# Patient Record
Sex: Female | Born: 1985 | Race: Black or African American | Hispanic: No | Marital: Single | Smoking: Never smoker
Health system: Southern US, Community
[De-identification: ages and names within clinical notes are randomized; demographics above are authoritative.]

## PROBLEM LIST (undated history)

## (undated) ENCOUNTER — Emergency Department (HOSPITAL_BASED_OUTPATIENT_CLINIC_OR_DEPARTMENT_OTHER): Discharge status: Absent without leave | Payer: Medicaid Other

---

## 2016-03-22 ENCOUNTER — Encounter (HOSPITAL_BASED_OUTPATIENT_CLINIC_OR_DEPARTMENT_OTHER): Payer: Self-pay | Admitting: Emergency Medicine

## 2016-03-22 ENCOUNTER — Emergency Department (HOSPITAL_BASED_OUTPATIENT_CLINIC_OR_DEPARTMENT_OTHER)
Admission: EM | Admit: 2016-03-22 | Discharge: 2016-03-22 | Disposition: A | Discharge status: Home / Self Care | Payer: Medicaid Other | Attending: Emergency Medicine | Admitting: Emergency Medicine

## 2016-03-22 DIAGNOSIS — H578 Other specified disorders of eye and adnexa: Secondary | ICD-10-CM | POA: Diagnosis present

## 2016-03-22 NOTE — ED Notes (Signed)
No answer

## 2016-03-22 NOTE — ED Triage Notes (Signed)
Left eye irritation for two days.  Pt states she has redness and tearing.  No known foreign body.

## 2016-10-28 ENCOUNTER — Emergency Department (HOSPITAL_BASED_OUTPATIENT_CLINIC_OR_DEPARTMENT_OTHER)
Admission: EM | Admit: 2016-10-28 | Discharge: 2016-10-28 | Disposition: A | Discharge status: Home / Self Care | Payer: Self-pay | Attending: Emergency Medicine | Admitting: Emergency Medicine

## 2016-10-28 ENCOUNTER — Encounter (HOSPITAL_BASED_OUTPATIENT_CLINIC_OR_DEPARTMENT_OTHER): Payer: Self-pay | Admitting: Emergency Medicine

## 2016-10-28 ENCOUNTER — Emergency Department (HOSPITAL_BASED_OUTPATIENT_CLINIC_OR_DEPARTMENT_OTHER): Payer: Self-pay

## 2016-10-28 DIAGNOSIS — M79605 Pain in left leg: Secondary | ICD-10-CM | POA: Insufficient documentation

## 2016-10-28 LAB — BASIC METABOLIC PANEL
Anion gap: 7 (ref 5–15)
BUN: 11 mg/dL (ref 6–20)
CHLORIDE: 104 mmol/L (ref 101–111)
CO2: 23 mmol/L (ref 22–32)
Calcium: 9.1 mg/dL (ref 8.9–10.3)
Creatinine, Ser: 0.78 mg/dL (ref 0.44–1.00)
GFR calc non Af Amer: >60 mL/min (ref >60)
Glucose, Bld: 91 mg/dL (ref 65–99)
POTASSIUM: 4 mmol/L (ref 3.5–5.1)
SODIUM: 134 mmol/L — AB (ref 135–145)

## 2016-10-28 NOTE — ED Triage Notes (Signed)
Patient states that she woke up last night with cramping to her left leg. The patient reports that she now has continues ache to her left calf. The patient is walking without difficulty but reports that she has worse pain with walking

## 2016-10-28 NOTE — Discharge Instructions (Signed)
Read the information below.  You may return to the Emergency Department at any time for worsening condition or any new symptoms that concern you. °

## 2016-10-28 NOTE — ED Notes (Signed)
Back from US.

## 2016-10-28 NOTE — ED Provider Notes (Signed)
[facility]   CSN: [id number] Arrival date & time: 10/28/16  4098     History   Chief Complaint Chief Complaint  Patient presents with  . Leg Pain    HPI Nancy Schneider is a 31 y.o. female.  HPI   Ptp presents with left calf pain that began last night.  States that during the night she developed cramping in her bilateral legs, L>R.  This morning she continued to have soreness in her left calf, worse with walking and standing.  She has taken no medications for her pain.  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  Denies recent immobilization, exogenous estrogen, history of blood clots.  Denies CP, SOB, cough, fevers, weakness or numbness of the legs.    History reviewed. No pertinent past medical history.  There are no active problems to display for this patient.   History reviewed. No pertinent surgical history.  OB History    No data available       Home Medications    Prior to Admission medications   Not on File    Family History History reviewed. No pertinent family history.  Social History Social History  Substance Use Topics  . Smoking status: Never Smoker  . Smokeless tobacco: Never Used  . Alcohol use No     Allergies   Patient has no known allergies.   Review of Systems Review of Systems  All other systems reviewed and are negative.    Physical Exam Updated Vital Signs BP 122/77 (BP Location: Right Arm)   Pulse 74   Temp 98.5 F (36.9 C) (Oral)   Resp 17   LMP 10/14/2016   SpO2 98%   Physical Exam  Constitutional: She appears well-developed and well-nourished. No distress.  HENT:  Head: Normocephalic and atraumatic.  Neck: Neck supple.  Cardiovascular: Normal rate and intact distal pulses.   Pulmonary/Chest: Effort normal.  Musculoskeletal: Normal range of motion. She exhibits no edema or deformity.  Left calf tender to  palpation.  No erythema or warmth.  Lower extremities:  Strength 5/5, sensation intact, distal pulses intact.   No bony tenderness.    Neurological: She is alert.  Skin: She is not diaphoretic.  Nursing note and vitals reviewed.    ED Treatments / Results  Labs (all labs ordered are listed, but only abnormal results are displayed) Labs Reviewed  BASIC METABOLIC PANEL - Abnormal; Notable for the following:       Result Value   Sodium 134 (*)    All other components within normal limits    EKG  EKG Interpretation None       Radiology US Venous Img Lower Unilateral Left  Result Date: 10/28/2016 CLINICAL DATA:  Left calf cramping this morning. EXAM: Left LOWER EXTREMITY VENOUS DOPPLER ULTRASOUND TECHNIQUE: Gray-scale sonography with graded compression, as well as color Doppler and duplex ultrasound were performed to evaluate the lower extremity deep venous systems from the level of the common femoral vein and including the common femoral, femoral, profunda femoral, popliteal and calf veins including the posterior tibial, peroneal and gastrocnemius veins when visible. The superficial great saphenous vein was also interrogated. Spectral Doppler was utilized to evaluate flow at rest and with distal augmentation maneuvers in the common femoral, femoral and popliteal veins. COMPARISON:  None. FINDINGS: Contralateral Common Femoral Vein: Respiratory phasicity is normal and symmetric with the symptomatic side. No evidence  of thrombus. Normal compressibility. Common Femoral Vein: No evidence of thrombus. Normal compressibility, respiratory phasicity and response to augmentation. Saphenofemoral Junction: No evidence of thrombus. Normal compressibility and flow on color Doppler imaging. Profunda Femoral Vein: No evidence of thrombus. Normal compressibility and flow on color Doppler imaging. Femoral Vein: No evidence of thrombus. Normal compressibility, respiratory phasicity and response to  augmentation. Popliteal Vein: No evidence of thrombus. Normal compressibility, respiratory phasicity and response to augmentation. Calf Veins: No evidence of thrombus. Normal compressibility and flow on color Doppler imaging. Superficial Great Saphenous Vein: No evidence of thrombus. Normal compressibility and flow on color Doppler imaging. Venous Reflux:  None. Other Findings:  None. IMPRESSION: No evidence of DVT within the left lower extremity. Electronically Signed   By: David  [place] M.D.   On: 10/28/2016 11:33    Procedures Procedures (including critical care time)  Medications Ordered in ED Medications - No data to display   Initial Impression / Assessment and Plan / ED Course  I have reviewed the triage vital signs and the nursing notes.  Pertinent labs & imaging results that were available during my care of the patient were reviewed by me and considered in my medical decision making (see chart for details).     Afebrile, nontoxic patient with cramping in her legs overnight and soreness in her left calf this morning.  DVT study is negative.  Electrolytes unremarkable.  Likely soreness from muscle cramps overnight. Pt declines any medications.   D/C home with PCP follow up.  Discussed result, findings, treatment, and follow up  with patient.  Pt given return precautions.  Pt verbalizes understanding and agrees with plan.       Final Clinical Impressions(s) / ED Diagnoses   Final diagnoses:  Left leg pain    New Prescriptions New Prescriptions   No medications on file     Trixie DredgeWest, Asjia Berrios, [place]-C 10/28/16 1158    Geoffery Lyonselo, [place], MD 10/28/16 1515

## 2018-03-14 IMAGING — US US EXTREM LOW VENOUS*L*
1 series · 13 of 24 positions shown · non-contrast
Comparison: None.

CLINICAL DATA: Left calf cramping this morning.



[Series 1: us extrem low venous*left* · 0.07mm/px · 13 of 28 slices shown]
[im 1/28]
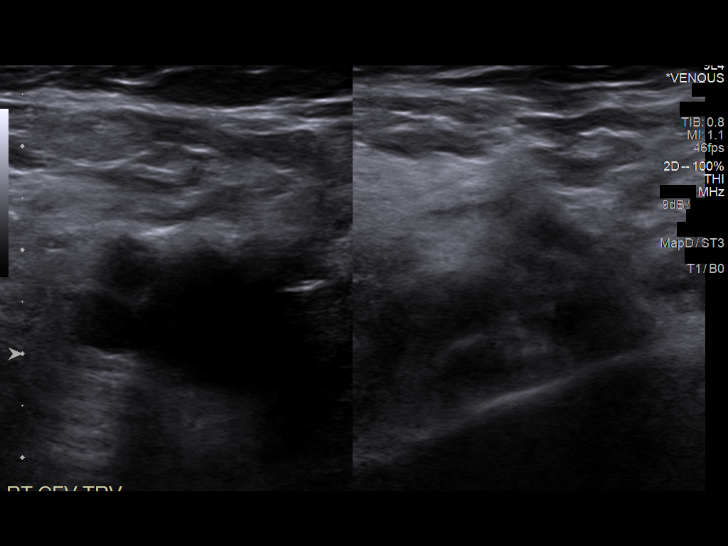
[im 3/28]
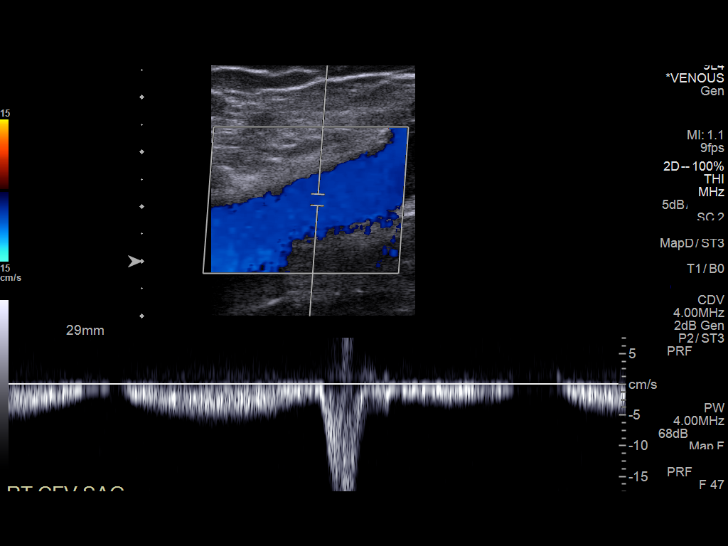
[im 5/28]
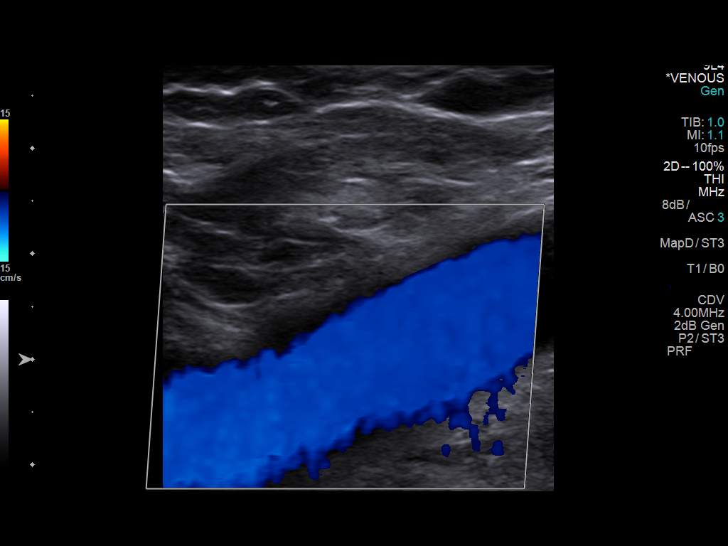
[im 8/28]
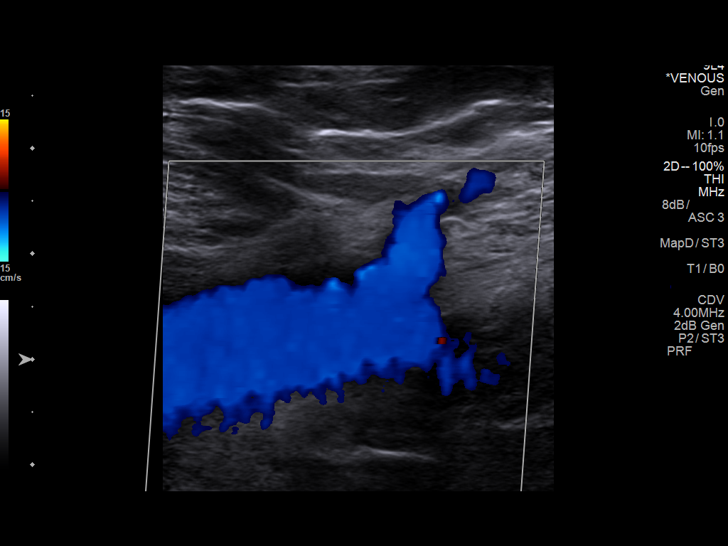
[im 10/28]
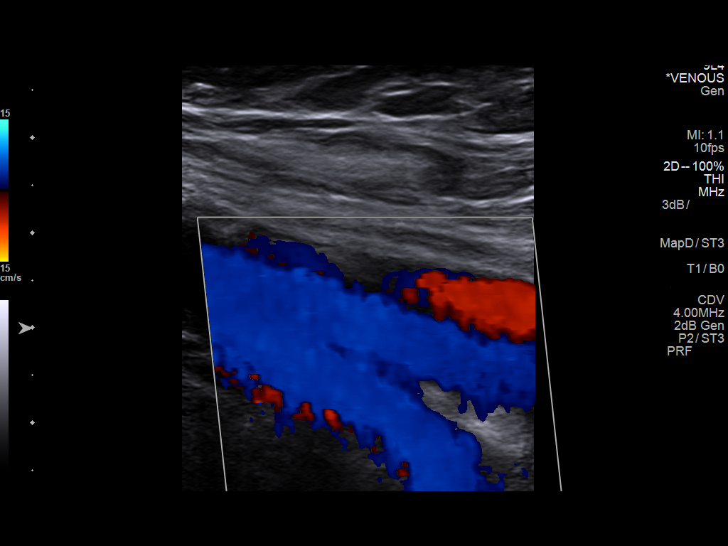
[im 12/28]
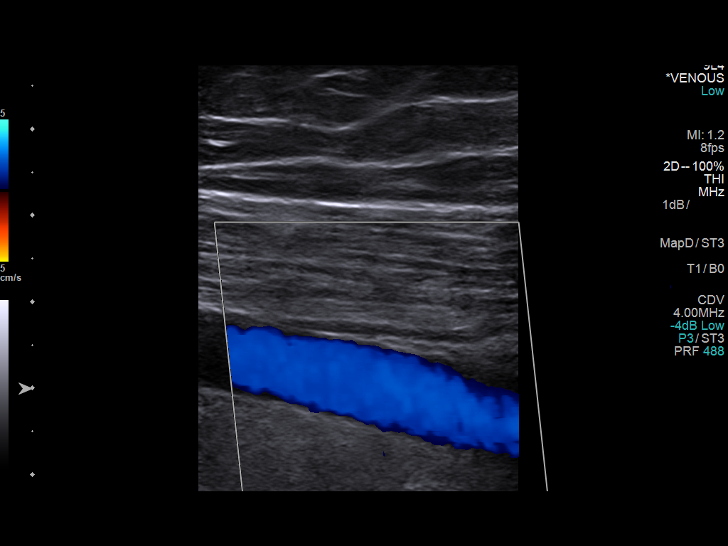
[im 15/28]
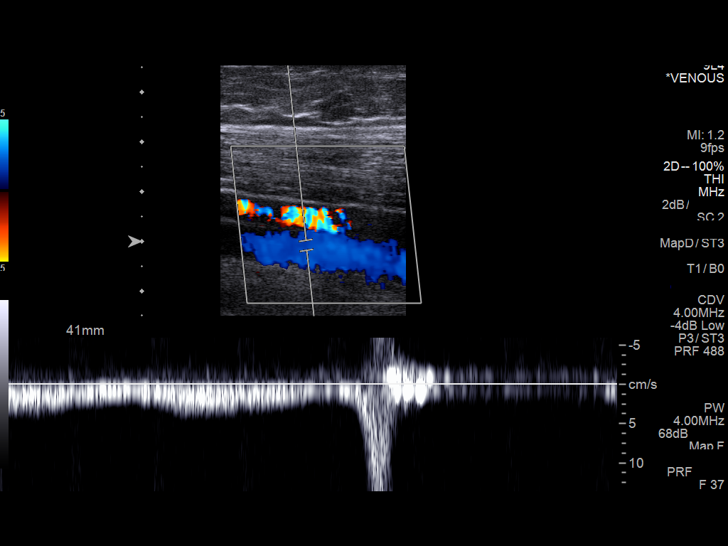
[im 16/28]
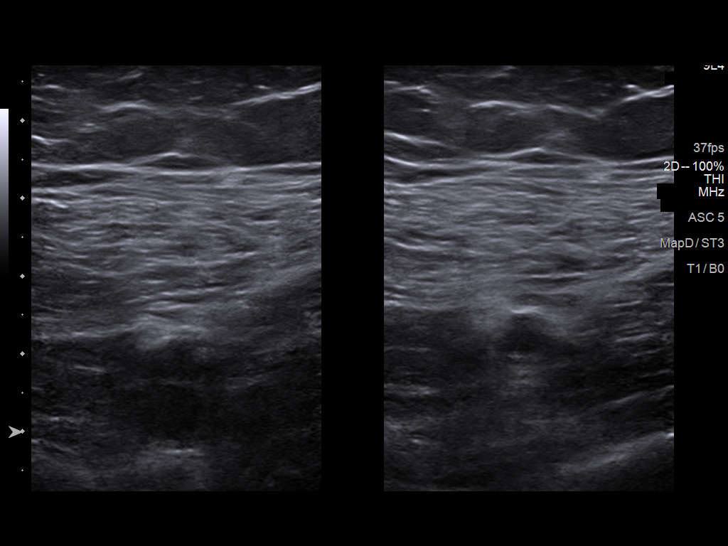
[im 18/28]
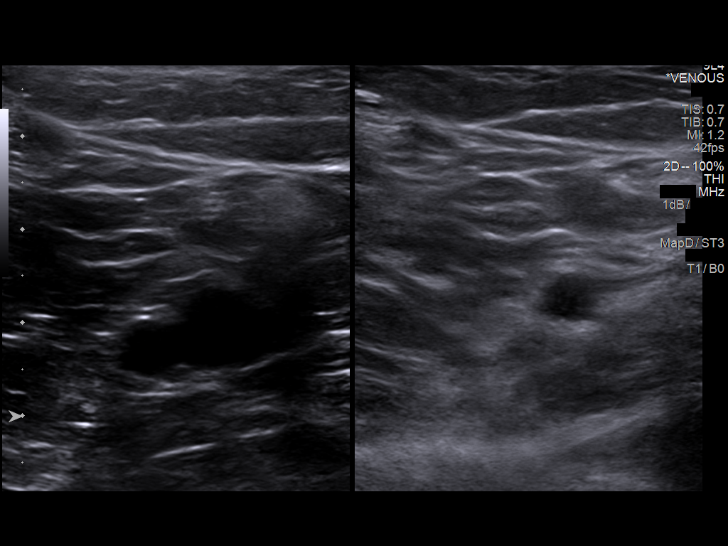
[im 20/28]
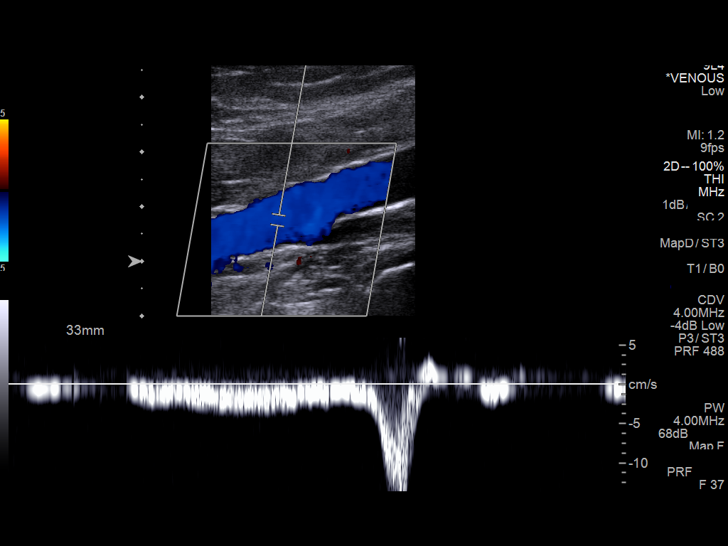
[im 23/28]
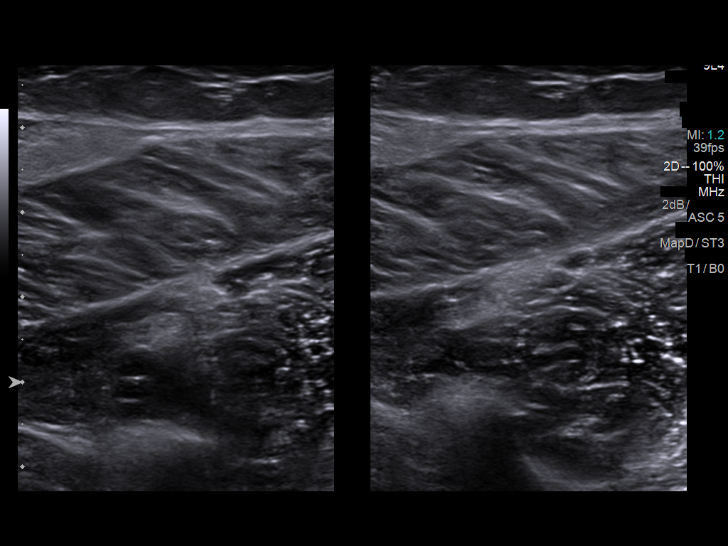
[im 25/28]
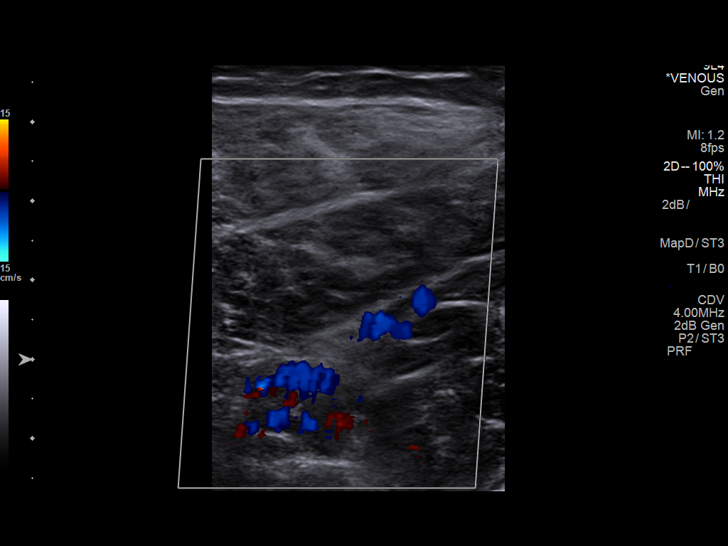
[im 28/28]
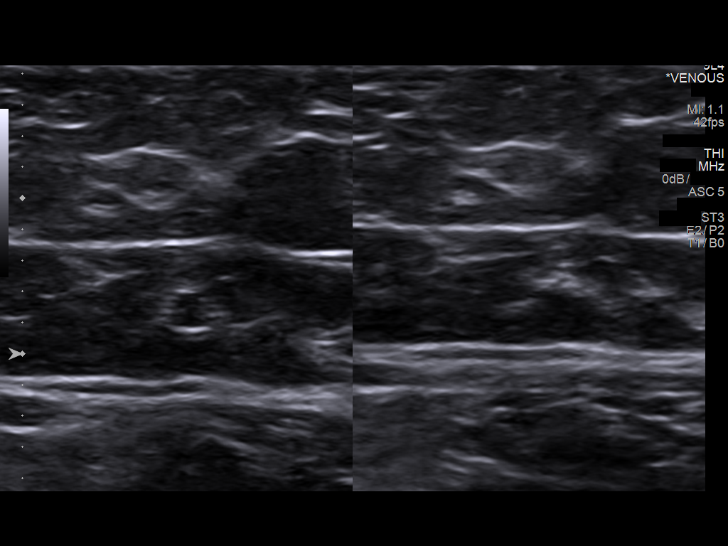

[13 of 24 positions shown; findings below may reference images not displayed]

FINDINGS: Contralateral Common Femoral Vein: Respiratory phasicity is normal
and symmetric with the symptomatic side. No evidence of thrombus.
Normal compressibility.

Common Femoral Vein: No evidence of thrombus. Normal
compressibility, respiratory phasicity and response to augmentation.

Saphenofemoral Junction: No evidence of thrombus. Normal
compressibility and flow on color Doppler imaging.

Profunda Femoral Vein: No evidence of thrombus. Normal
compressibility and flow on color Doppler imaging.

Femoral Vein: No evidence of thrombus. Normal compressibility,
respiratory phasicity and response to augmentation.

Popliteal Vein: No evidence of thrombus. Normal compressibility,
respiratory phasicity and response to augmentation.

Calf Veins: No evidence of thrombus. Normal compressibility and flow
on color Doppler imaging.

Superficial Great Saphenous Vein: No evidence of thrombus. Normal
compressibility and flow on color Doppler imaging.

Venous Reflux:  None.

Other Findings:  None.
IMPRESSION: No evidence of DVT within the left lower extremity.

## 2019-04-16 ENCOUNTER — Encounter (HOSPITAL_BASED_OUTPATIENT_CLINIC_OR_DEPARTMENT_OTHER): Payer: Self-pay | Admitting: Emergency Medicine

## 2019-04-16 ENCOUNTER — Emergency Department (HOSPITAL_BASED_OUTPATIENT_CLINIC_OR_DEPARTMENT_OTHER)
Admission: EM | Admit: 2019-04-16 | Discharge: 2019-04-16 | Disposition: A | Discharge status: Home / Self Care | Payer: Medicaid Other | Attending: Emergency Medicine | Admitting: Emergency Medicine

## 2019-04-16 ENCOUNTER — Other Ambulatory Visit: Payer: Self-pay

## 2019-04-16 DIAGNOSIS — Z20822 Contact with and (suspected) exposure to covid-19: Secondary | ICD-10-CM

## 2019-04-16 DIAGNOSIS — Z20828 Contact with and (suspected) exposure to other viral communicable diseases: Secondary | ICD-10-CM | POA: Diagnosis present

## 2019-04-16 NOTE — ED Provider Notes (Signed)
[facility] EMERGENCY DEPARTMENT Provider Note   CSN: [id number] Arrival date & time: 04/16/19  1093     History   Chief Complaint Chief Complaint  Patient presents with  . wants covid test    HPI Nancy Schneider is a 33 y.o. female.  She presents for Covid testing because her daughter tested positive.  She has no symptoms.  No fever no current headache, no body aches no sore throat nausea vomiting diarrhea.     The history is provided by the patient.    History reviewed. No pertinent past medical history.  There are no active problems to display for this patient.   History reviewed. No pertinent surgical history.   OB History   No obstetric history on file.      Home Medications    Prior to Admission medications   Not on File    Family History No family history on file.  Social History Social History   Tobacco Use  . Smoking status: Never Smoker  . Smokeless tobacco: Never Used  Substance Use Topics  . Alcohol use: No  . Drug use: No     Allergies   Patient has no known allergies.   Review of Systems Review of Systems  Constitutional: Negative for fever.  HENT: Negative for sore throat.   Eyes: Negative for visual disturbance.  Respiratory: Negative for shortness of breath.   Cardiovascular: Negative for chest pain.  Gastrointestinal: Negative for abdominal pain.  Genitourinary: Negative for dysuria.  Musculoskeletal: Negative for myalgias.  Skin: Negative for rash.  Neurological: Positive for headaches (none now).     Physical Exam Updated Vital Signs BP (!) 114/99 (BP Location: Right Arm)   Pulse 84   Temp 99 F (37.2 C)   Resp 18   Ht 5\' 4"  (1.626 m)   Wt 74.8 kg   SpO2 100%   BMI 28.32 kg/m   Physical Exam Constitutional:      Appearance: She is well-developed.  HENT:     Head: Normocephalic and atraumatic.  Eyes:     Conjunctiva/sclera: Conjunctivae normal.  Neck:     Musculoskeletal: Neck supple.   Cardiovascular:     Rate and Rhythm: Normal rate and regular rhythm.     Pulses: Normal pulses.  Pulmonary:     Effort: Pulmonary effort is normal.     Breath sounds: Normal breath sounds.  Skin:    General: Skin is warm and dry.  Neurological:     General: No focal deficit present.     Mental Status: She is alert.     GCS: GCS eye subscore is 4. GCS verbal subscore is 5. GCS motor subscore is 6.      ED Treatments / Results  Labs (all labs ordered are listed, but only abnormal results are displayed) Labs Reviewed  NOVEL CORONAVIRUS, NAA (HOSP ORDER, SEND-OUT TO REF LAB; TAT 18-24 HRS)    EKG None  Radiology No results found.  Procedures Procedures (including critical care time)  Medications Ordered in ED Medications - No data to display   Initial Impression / Assessment and Plan / ED Course  I have reviewed the triage vital signs and the nursing notes.  Pertinent labs & imaging results that were available during my care of the patient were reviewed by me and considered in my medical decision making (see chart for details).    Nancy Schneider was evaluated in Emergency Department on 04/16/2019 for the symptoms described in the history of  present illness. She was evaluated in the context of the global COVID-19 pandemic, which necessitated consideration that the patient might be at risk for infection with the SARS-CoV-2 virus that causes COVID-19. Institutional protocols and algorithms that pertain to the evaluation of patients at risk for COVID-19 are in a state of rapid change based on information released by regulatory bodies including the CDC and federal and state organizations. These policies and algorithms were followed during the patient's care in the ED.       Final Clinical Impressions(s) / ED Diagnoses   Final diagnoses:  Person under investigation for COVID-19    ED Discharge Orders    None       Terrilee Files, MD 04/16/19 1752

## 2019-04-16 NOTE — Discharge Instructions (Signed)
You were evaluated in the emergency department for close contact exposure with Covid.  You will need to isolate until your tests are resulted.

## 2019-04-16 NOTE — ED Triage Notes (Signed)
Patient reports daughter tested positive for covid so she would like a test as well.  Endorses a headache this week but denies any symptoms at this time.

## 2019-04-17 LAB — NOVEL CORONAVIRUS, NAA (HOSP ORDER, SEND-OUT TO REF LAB; TAT 18-24 HRS): SARS-CoV-2, NAA: NOT DETECTED

## 2020-01-05 ENCOUNTER — Other Ambulatory Visit: Payer: Self-pay

## 2020-01-05 ENCOUNTER — Encounter (HOSPITAL_BASED_OUTPATIENT_CLINIC_OR_DEPARTMENT_OTHER): Payer: Self-pay | Admitting: Emergency Medicine

## 2020-01-05 ENCOUNTER — Emergency Department (HOSPITAL_BASED_OUTPATIENT_CLINIC_OR_DEPARTMENT_OTHER)
Admission: EM | Admit: 2020-01-05 | Discharge: 2020-01-05 | Disposition: A | Discharge status: Home / Self Care | Payer: Medicaid Other | Attending: Emergency Medicine | Admitting: Emergency Medicine

## 2020-01-05 DIAGNOSIS — O8600 Infection of obstetric surgical wound, unspecified: Secondary | ICD-10-CM

## 2020-01-05 DIAGNOSIS — B9689 Other specified bacterial agents as the cause of diseases classified elsewhere: Secondary | ICD-10-CM | POA: Insufficient documentation

## 2020-01-05 DIAGNOSIS — O8609 Infection of obstetric surgical wound, other surgical site: Secondary | ICD-10-CM | POA: Diagnosis not present

## 2020-01-05 MED ORDER — CEPHALEXIN 250 MG PO CAPS
500.0000 mg | ORAL_CAPSULE | Freq: Once | ORAL | Status: AC
  Administered 2020-01-05: 500 mg via ORAL
  Filled 2020-01-05: qty 2

## 2020-01-05 MED ORDER — CEPHALEXIN 500 MG PO CAPS: 500.0000 mg | ORAL_CAPSULE | Freq: Three times a day (TID) | ORAL | 0 refills | Status: AC

## 2020-01-05 NOTE — Discharge Instructions (Signed)
Please read instructions below.  Keep your wound clean and covered. Soak/flush your wound with warm water, multiple times per day. Express the pus every hour while awake. You can take Advil/ibuprofen every 6 hours as needed for pain. Take the antibiotic, cephalexin/keflex, every 8 hours until gone. Schedule a follow up appointment with Dr. Allena Katz on Monday for close recheck and further management. Return to the ER for fever, worsening redness, or new or worsening symptoms.

## 2020-01-05 NOTE — ED Provider Notes (Signed)
[facility] EMERGENCY DEPARTMENT Provider Note   CSN: [id number] Arrival date & time: 01/05/20  1115     History Chief Complaint  Patient presents with  . Wound Dehiscence    Nancy Schneider is a 34 y.o. female, G2P2, 6 weeks postpartum after repeat low transverse C/S at [redacted]w[redacted]d on 11/23/2019 at [facility]. She is presenting with pain and drainage from C/S wound that began about 3 days ago. She states she initially noticed redness 3 days, and today it began draining pus. She endorses localized pain to the site, though no generalized abdominal pain. She is still having gradually improving vaginal bleeding, no other abnormal vaginal discharge. Denies fever, chills. She states she had uncomplicated pregnancy and postpartum course thus far. She is followed by Dr. Allena Katz with [facility]. She is not breastfeeding.   The history is provided by the patient and medical records.       History reviewed. No pertinent past medical history.  There are no problems to display for this patient.   Past Surgical History:  Procedure Laterality Date  . CESAREAN SECTION       OB History   No obstetric history on file.     History reviewed. No pertinent family history.  Social History   Tobacco Use  . Smoking status: Never Smoker  . Smokeless tobacco: Never Used  Substance Use Topics  . Alcohol use: No  . Drug use: No    Home Medications Prior to Admission medications   Medication Sig Start Date End Date Taking? Authorizing Provider  cephALEXin (KEFLEX) 500 MG capsule Take 1 capsule (500 mg total) by mouth 3 (three) times daily for 7 days. 01/05/20 01/12/20  Jahmani Staup, [place], PA-C    Allergies    Patient has no known allergies.  Review of Systems   Review of Systems  Constitutional: Negative for chills and fever.  Skin: Positive for color change and wound.  All other systems reviewed and are negative.   Physical Exam Updated Vital Signs BP 124/90 (BP  Location: Right Arm)   Pulse 72   Temp 98.5 F (36.9 C) (Oral)   Resp 16   Ht 5\' 4"  (1.626 m)   Wt 72.6 kg   SpO2 98%   Breastfeeding No   BMI 27.46 kg/m   Physical Exam Vitals and nursing note reviewed.  Constitutional:      General: She is not in acute distress.    Appearance: She is well-developed. She is not ill-appearing.  HENT:     Head: Normocephalic and atraumatic.  Eyes:     Conjunctiva/sclera: Conjunctivae normal.  Cardiovascular:     Rate and Rhythm: Normal rate and regular rhythm.  Pulmonary:     Effort: Pulmonary effort is normal.     Breath sounds: Normal breath sounds.  Abdominal:     General: Bowel sounds are normal.     Palpations: Abdomen is soft.     Tenderness: There is no abdominal tenderness. There is no guarding or rebound.  Skin:    General: Skin is warm.     Comments: C/S wound with purulent drainage from left lateral portion, with assoc induration and localized tenderness. No large dehiscence. No large surrounding erythema. Right portion of wound appears to be healing well.   Neurological:     Mental Status: She is alert.  Psychiatric:        Behavior: Behavior normal.     ED Results / Procedures / Treatments   Labs (  all labs ordered are listed, but only abnormal results are displayed) Labs Reviewed - No data to display  EKG None  Radiology No results found.  Procedures Procedures (including critical care time)  Medications Ordered in ED Medications  cephALEXin (KEFLEX) capsule 500 mg (500 mg Oral Given 01/05/20 1305)    ED Course  I have reviewed the triage vital signs and the nursing notes.  Pertinent labs & imaging results that were available during my care of the patient were reviewed by me and considered in my medical decision making (see chart for details).  Clinical Course as of Jan 04 1422  Sat Jan 05, 2020  1257 Consulted with Dr. Allena Katz with [facility]. He recommends Keflex BID x 1 week, warm compress, follow up on  Monday in office. Appreciate consult.   [JR]    Clinical Course User Index [JR] Danaly Bari, [place], PA-C   MDM Rules/Calculators/A&P                          Patient presenting to the ED 6 weeks postpartum with infected C-section site.  She noticed redness and drainage that began 3 days ago.  The left lateral aspect of the wound is draining pus that is expressed with palpation.  No significant surrounding signs of cellulitis.  No generalized abdominal pain.  Patient reports improving vaginal bleeding, no abnormal vaginal discharge.  No fevers or chills, she is very well-appearing and in no distress.  Consulted with her OB specialist at [facility], Dr. Allena Katz, who recommends Keflex twice daily for 1 week, continue warm compresses and expressing the wound every hour while awake.  Discussed these recommendations with patient.  She is also instructed to follow-up on Monday with Dr. Allena Katz in his clinic, per his request.  She is instructed of strict return precautions.  She verbalized understanding agrees with care plan.  Final Clinical Impression(s) / ED Diagnoses Final diagnoses:  Wound infection following cesarean section, postpartum    Rx / DC Orders ED Discharge Orders         Ordered    cephALEXin (KEFLEX) 500 MG capsule  3 times daily     Discontinue  Reprint     01/05/20 1303           Josep Luviano, [place], PA-C 01/05/20 1423    Maia Plan, MD 01/06/20 [phone]

## 2020-01-05 NOTE — ED Triage Notes (Signed)
Pt arrives pov with c/o drainage from cesarean section incision site on left side. No apparent bleeding, mild discharge noted. Pt denies fever, endorses tenderness

## 2020-01-05 NOTE — ED Notes (Signed)
Called [facility] line to get dr on call

## 2020-03-08 DIAGNOSIS — F1220 Cannabis dependence, uncomplicated: Secondary | ICD-10-CM | POA: Diagnosis present

## 2020-03-10 DIAGNOSIS — F3113 Bipolar disorder, current episode manic without psychotic features, severe: Secondary | ICD-10-CM | POA: Diagnosis present

## 2020-03-13 ENCOUNTER — Emergency Department (HOSPITAL_COMMUNITY)
Admission: EM | Admit: 2020-03-13 | Discharge: 2020-03-15 | Disposition: A | Discharge status: Other Acute Inpatient Hospital | Payer: Medicaid Other | Attending: Emergency Medicine | Admitting: Emergency Medicine

## 2020-03-13 DIAGNOSIS — R000 Tachycardia, unspecified: Secondary | ICD-10-CM | POA: Insufficient documentation

## 2020-03-13 DIAGNOSIS — R45851 Suicidal ideations: Secondary | ICD-10-CM | POA: Diagnosis not present

## 2020-03-13 DIAGNOSIS — F29 Unspecified psychosis not due to a substance or known physiological condition: Secondary | ICD-10-CM

## 2020-03-13 DIAGNOSIS — Z20822 Contact with and (suspected) exposure to covid-19: Secondary | ICD-10-CM | POA: Insufficient documentation

## 2020-03-13 DIAGNOSIS — F312 Bipolar disorder, current episode manic severe with psychotic features: Secondary | ICD-10-CM | POA: Diagnosis not present

## 2020-03-13 LAB — COMPREHENSIVE METABOLIC PANEL
ALT: 25 U/L (ref 0–44)
AST: 25 U/L (ref 15–41)
Albumin: 4.3 g/dL (ref 3.5–5.0)
Alkaline Phosphatase: 71 U/L (ref 38–126)
Anion gap: 10 (ref 5–15)
BUN: 13 mg/dL (ref 6–20)
CO2: 25 mmol/L (ref 22–32)
Calcium: 9.6 mg/dL (ref 8.9–10.3)
Chloride: 103 mmol/L (ref 98–111)
Creatinine, Ser: 0.97 mg/dL (ref 0.44–1.00)
GFR, Estimated: >60 mL/min (ref >60)
Glucose, Bld: 109 mg/dL — ABNORMAL HIGH (ref 70–99)
Potassium: 3.6 mmol/L (ref 3.5–5.1)
Sodium: 138 mmol/L (ref 135–145)
Total Bilirubin: 1 mg/dL (ref 0.3–1.2)
Total Protein: 7.7 g/dL (ref 6.5–8.1)

## 2020-03-13 LAB — CBC WITH DIFFERENTIAL/PLATELET
Abs Immature Granulocytes: 0.01 10*3/uL (ref 0.00–0.07)
Basophils Absolute: 0.1 10*3/uL (ref 0.0–0.1)
Basophils Relative: 1 %
Eosinophils Absolute: 0.1 10*3/uL (ref 0.0–0.5)
Eosinophils Relative: 2 %
HCT: 39 % (ref 36.0–46.0)
Hemoglobin: 13.5 g/dL (ref 12.0–15.0)
Immature Granulocytes: 0 %
Lymphocytes Relative: 32 %
Lymphs Abs: 1.9 10*3/uL (ref 0.7–4.0)
MCH: 31.6 pg (ref 26.0–34.0)
MCHC: 34.6 g/dL (ref 30.0–36.0)
MCV: 91.3 fL (ref 80.0–100.0)
Monocytes Absolute: 0.7 10*3/uL (ref 0.1–1.0)
Monocytes Relative: 13 %
Neutro Abs: 3 10*3/uL (ref 1.7–7.7)
Neutrophils Relative %: 52 %
Platelets: 408 10*3/uL — ABNORMAL HIGH (ref 150–400)
RBC: 4.27 MIL/uL (ref 3.87–5.11)
RDW: 13.7 % (ref 11.5–15.5)
WBC: 5.7 10*3/uL (ref 4.0–10.5)
nRBC: 0 % (ref 0.0–0.2)

## 2020-03-13 LAB — RAPID URINE DRUG SCREEN, HOSP PERFORMED
Amphetamines: NOT DETECTED
Barbiturates: NOT DETECTED
Benzodiazepines: NOT DETECTED
Cocaine: NOT DETECTED
Opiates: NOT DETECTED
Tetrahydrocannabinol: POSITIVE — AB

## 2020-03-13 LAB — ETHANOL: Alcohol, Ethyl (B): <10 mg/dL (ref <10)

## 2020-03-13 LAB — I-STAT BETA HCG BLOOD, ED (MC, WL, AP ONLY): I-stat hCG, quantitative: <5 m[IU]/mL (ref <5)

## 2020-03-13 MED ORDER — ZIPRASIDONE MESYLATE 20 MG IM SOLR
20.0000 mg | Freq: Once | INTRAMUSCULAR | Status: AC
  Administered 2020-03-13: 20 mg via INTRAMUSCULAR
  Filled 2020-03-13: qty 20

## 2020-03-13 MED ORDER — DIVALPROEX SODIUM ER 500 MG PO TB24
500.0000 mg | ORAL_TABLET | Freq: Every day | ORAL | Status: DC
  Administered 2020-03-13 – 2020-03-15 (×3): 500 mg via ORAL
  Filled 2020-03-13 (×3): qty 1

## 2020-03-13 MED ORDER — ONDANSETRON HCL 4 MG PO TABS: 4.0000 mg | ORAL_TABLET | Freq: Three times a day (TID) | ORAL | Status: DC | PRN

## 2020-03-13 MED ORDER — ACETAMINOPHEN 325 MG PO TABS
650.0000 mg | ORAL_TABLET | ORAL | Status: DC | PRN
  Administered 2020-03-14 (×2): 650 mg via ORAL
  Filled 2020-03-13 (×2): qty 2

## 2020-03-13 MED ORDER — QUETIAPINE FUMARATE 100 MG PO TABS
100.0000 mg | ORAL_TABLET | Freq: Every day | ORAL | Status: DC
  Administered 2020-03-13 – 2020-03-14 (×2): 100 mg via ORAL
  Filled 2020-03-13 (×2): qty 1

## 2020-03-13 MED ORDER — ALUM & MAG HYDROXIDE-SIMETH 200-200-20 MG/5ML PO SUSP: 30.0000 mL | Freq: Four times a day (QID) | ORAL | Status: DC | PRN

## 2020-03-13 MED ORDER — STERILE WATER FOR INJECTION IJ SOLN
INTRAMUSCULAR | Status: AC
  Filled 2020-03-13: qty 10
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
## 2020-03-13 NOTE — ED Notes (Signed)
Pt disorganized, labile, screaming at times, uncooperative and difficult to redirect at times.  PRN Geodon 20 mg IM given,effective Pt resting comfortably

## 2020-03-14 LAB — RESPIRATORY PANEL BY RT PCR (FLU A&B, COVID)
Influenza A by PCR: NEGATIVE
Influenza B by PCR: NEGATIVE
SARS Coronavirus 2 by RT PCR: NEGATIVE

## 2020-03-14 MED ORDER — LORAZEPAM 2 MG/ML IJ SOLN
2.0000 mg | Freq: Once | INTRAMUSCULAR | Status: AC
  Administered 2020-03-14: 2 mg via INTRAMUSCULAR
  Filled 2020-03-14: qty 1

## 2020-03-14 NOTE — ED Notes (Signed)
Informed Junior, RN police said patient keeps calling them.

## 2020-03-14 NOTE — ED Notes (Signed)
Patient making a personal phone call  

## 2020-03-14 NOTE — ED Notes (Signed)
Patient's lunch tray delivered.

## 2020-03-14 NOTE — ED Notes (Signed)
Patient sleeping/rise and fall of chest breathing observed 
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
## 2020-03-14 NOTE — ED Notes (Signed)
Pt laying in bed, alert.  Calm at this time, sitter at bedside.

## 2020-03-14 NOTE — BH Assessment (Signed)
[facility] Assessment Progress Note  Per Nira Conn, NP, this pt requires psychiatric hospitalization at this time.  Pt presents under IVC initiated by EDP Meridee Score, MD.  As of this writing, pt is under consideration for admission at [facility] and IVC documents have been faxed to [facility].  [facility] is reportedly at capacity.  EDP Lorre Nick, MD and pt's nurse, Junior, have been notified.  Pt's final disposition is pending.  Doylene Canning, [place] Behavioral Health Coordinator [phone]

## 2020-03-15 ENCOUNTER — Inpatient Hospital Stay (HOSPITAL_COMMUNITY)
Admission: AD | Admit: 2020-03-15 | Discharge: 2020-03-18 | DRG: 885 | Disposition: A | Discharge status: Home / Self Care | Payer: Medicaid Other | Source: Intra-hospital | Attending: Psychiatry | Admitting: Psychiatry

## 2020-03-15 ENCOUNTER — Encounter (HOSPITAL_COMMUNITY): Payer: Self-pay | Admitting: Nurse Practitioner

## 2020-03-15 DIAGNOSIS — Y92410 Unspecified street and highway as the place of occurrence of the external cause: Secondary | ICD-10-CM | POA: Diagnosis not present

## 2020-03-15 DIAGNOSIS — R739 Hyperglycemia, unspecified: Secondary | ICD-10-CM | POA: Diagnosis present

## 2020-03-15 DIAGNOSIS — G4700 Insomnia, unspecified: Secondary | ICD-10-CM | POA: Diagnosis present

## 2020-03-15 DIAGNOSIS — Z9114 Patient's other noncompliance with medication regimen: Secondary | ICD-10-CM

## 2020-03-15 DIAGNOSIS — F1910 Other psychoactive substance abuse, uncomplicated: Secondary | ICD-10-CM | POA: Clinically undetermined

## 2020-03-15 DIAGNOSIS — F3113 Bipolar disorder, current episode manic without psychotic features, severe: Secondary | ICD-10-CM | POA: Diagnosis present

## 2020-03-15 DIAGNOSIS — F1220 Cannabis dependence, uncomplicated: Secondary | ICD-10-CM | POA: Diagnosis present

## 2020-03-15 DIAGNOSIS — F419 Anxiety disorder, unspecified: Secondary | ICD-10-CM | POA: Diagnosis present

## 2020-03-15 DIAGNOSIS — F319 Bipolar disorder, unspecified: Secondary | ICD-10-CM | POA: Diagnosis present

## 2020-03-15 DIAGNOSIS — F312 Bipolar disorder, current episode manic severe with psychotic features: Secondary | ICD-10-CM

## 2020-03-15 MED ORDER — DIVALPROEX SODIUM ER 500 MG PO TB24: 500.0000 mg | ORAL_TABLET | Freq: Every day | ORAL | Status: DC

## 2020-03-15 MED ORDER — OLANZAPINE 10 MG PO TABS
30.0000 mg | ORAL_TABLET | Freq: Once | ORAL | Status: DC
  Filled 2020-03-15: qty 3

## 2020-03-15 MED ORDER — OLANZAPINE 10 MG PO TABS
10.0000 mg | ORAL_TABLET | Freq: Two times a day (BID) | ORAL | Status: DC
  Administered 2020-03-15 – 2020-03-18 (×5): 10 mg via ORAL
  Filled 2020-03-15 (×9): qty 1

## 2020-03-15 MED ORDER — DIPHENHYDRAMINE HCL 25 MG PO CAPS
50.0000 mg | ORAL_CAPSULE | Freq: Once | ORAL | Status: AC
  Administered 2020-03-15: 50 mg via ORAL
  Filled 2020-03-15: qty 2

## 2020-03-15 MED ORDER — MAGNESIUM HYDROXIDE 400 MG/5ML PO SUSP
30.0000 mL | Freq: Every day | ORAL | Status: DC | PRN
  Administered 2020-03-18: 30 mL via ORAL
  Filled 2020-03-15: qty 30

## 2020-03-15 MED ORDER — LORAZEPAM 2 MG/ML IJ SOLN: 2.0000 mg | Freq: Once | INTRAMUSCULAR | Status: DC

## 2020-03-15 MED ORDER — OLANZAPINE 10 MG PO TBDP
10.0000 mg | ORAL_TABLET | Freq: Three times a day (TID) | ORAL | Status: DC | PRN
  Administered 2020-03-17: 10 mg via ORAL
  Filled 2020-03-15 (×2): qty 1

## 2020-03-15 MED ORDER — ZIPRASIDONE MESYLATE 20 MG IM SOLR
20.0000 mg | INTRAMUSCULAR | Status: AC | PRN
  Administered 2020-03-16: 20 mg via INTRAMUSCULAR
  Filled 2020-03-15 (×2): qty 20

## 2020-03-15 MED ORDER — ALUM & MAG HYDROXIDE-SIMETH 200-200-20 MG/5ML PO SUSP: 30.0000 mL | ORAL | Status: DC | PRN

## 2020-03-15 MED ORDER — OLANZAPINE 10 MG PO TBDP
10.0000 mg | ORAL_TABLET | Freq: Once | ORAL | Status: DC
  Filled 2020-03-15 (×2): qty 1

## 2020-03-15 MED ORDER — DIPHENHYDRAMINE HCL 25 MG PO CAPS
50.0000 mg | ORAL_CAPSULE | Freq: Four times a day (QID) | ORAL | Status: DC | PRN
  Administered 2020-03-16 – 2020-03-17 (×2): 50 mg via ORAL
  Filled 2020-03-15 (×2): qty 2

## 2020-03-15 MED ORDER — DIPHENHYDRAMINE HCL 50 MG/ML IJ SOLN
50.0000 mg | Freq: Once | INTRAMUSCULAR | Status: DC
  Filled 2020-03-15 (×2): qty 1

## 2020-03-15 MED ORDER — DIPHENHYDRAMINE HCL 50 MG/ML IJ SOLN
50.0000 mg | Freq: Once | INTRAMUSCULAR | Status: AC
  Administered 2020-03-15: 50 mg via INTRAMUSCULAR
  Filled 2020-03-15 (×2): qty 1

## 2020-03-15 MED ORDER — OLANZAPINE 10 MG PO TBDP
ORAL_TABLET | ORAL | Status: AC
  Filled 2020-03-15: qty 1

## 2020-03-15 MED ORDER — ACETAMINOPHEN 325 MG PO TABS
650.0000 mg | ORAL_TABLET | Freq: Four times a day (QID) | ORAL | Status: DC | PRN
  Administered 2020-03-15 – 2020-03-17 (×3): 650 mg via ORAL
  Filled 2020-03-15 (×3): qty 2

## 2020-03-15 MED ORDER — LORAZEPAM 2 MG/ML IJ SOLN
2.0000 mg | Freq: Once | INTRAMUSCULAR | Status: DC
  Filled 2020-03-15: qty 1

## 2020-03-15 MED ORDER — DIPHENHYDRAMINE HCL 50 MG PO CAPS
50.0000 mg | ORAL_CAPSULE | Freq: Once | ORAL | Status: AC
  Administered 2020-03-15: 50 mg via ORAL
  Filled 2020-03-15: qty 1

## 2020-03-15 MED ORDER — LORAZEPAM 1 MG PO TABS: 2.0000 mg | ORAL_TABLET | Freq: Once | ORAL | Status: DC

## 2020-03-15 MED ORDER — DIPHENHYDRAMINE HCL 25 MG PO CAPS
ORAL_CAPSULE | ORAL | Status: AC
  Filled 2020-03-15: qty 2

## 2020-03-15 MED ORDER — DIPHENHYDRAMINE HCL 50 MG/ML IJ SOLN
50.0000 mg | Freq: Four times a day (QID) | INTRAMUSCULAR | Status: DC | PRN
  Administered 2020-03-16: 50 mg via INTRAMUSCULAR
  Filled 2020-03-15: qty 1

## 2020-03-15 MED ORDER — QUETIAPINE FUMARATE 100 MG PO TABS: 100.0000 mg | ORAL_TABLET | Freq: Every day | ORAL | Status: DC

## 2020-03-15 MED ORDER — ZIPRASIDONE MESYLATE 20 MG IM SOLR
20.0000 mg | Freq: Once | INTRAMUSCULAR | Status: AC
  Administered 2020-03-15: 20 mg via INTRAMUSCULAR
  Filled 2020-03-15 (×2): qty 20

## 2020-03-15 MED ORDER — LORAZEPAM 2 MG/ML IJ SOLN
2.0000 mg | Freq: Four times a day (QID) | INTRAMUSCULAR | Status: DC | PRN
  Administered 2020-03-16: 2 mg via INTRAMUSCULAR
  Filled 2020-03-15: qty 1

## 2020-03-15 MED ORDER — HYDROXYZINE HCL 25 MG PO TABS
25.0000 mg | ORAL_TABLET | Freq: Three times a day (TID) | ORAL | Status: DC | PRN
  Administered 2020-03-15 – 2020-03-17 (×2): 25 mg via ORAL
  Filled 2020-03-15 (×3): qty 1

## 2020-03-15 MED ORDER — LORAZEPAM 1 MG PO TABS
ORAL_TABLET | ORAL | Status: AC
  Filled 2020-03-15: qty 2

## 2020-03-15 MED ORDER — TRAZODONE HCL 50 MG PO TABS
50.0000 mg | ORAL_TABLET | Freq: Every evening | ORAL | Status: DC | PRN
  Administered 2020-03-15 – 2020-03-16 (×2): 50 mg via ORAL
  Filled 2020-03-15 (×2): qty 1

## 2020-03-15 MED ORDER — LORAZEPAM 1 MG PO TABS
2.0000 mg | ORAL_TABLET | Freq: Four times a day (QID) | ORAL | Status: DC | PRN
  Administered 2020-03-16 – 2020-03-18 (×4): 2 mg via ORAL
  Filled 2020-03-15 (×4): qty 2

## 2020-03-15 MED ORDER — DIPHENHYDRAMINE HCL 50 MG/ML IJ SOLN
50.0000 mg | Freq: Once | INTRAMUSCULAR | Status: DC
  Filled 2020-03-15: qty 1

## 2020-03-15 MED ORDER — LORAZEPAM 2 MG/ML IJ SOLN
2.0000 mg | Freq: Once | INTRAMUSCULAR | Status: AC
  Administered 2020-03-15: 2 mg via INTRAMUSCULAR

## 2020-03-15 NOTE — BH Assessment (Signed)
[facility] Assessment Progress Note  Per [facility] Kim, patient has been accepted to [facility] 505-1 to Dr. Tamera Punt.  Pt can come over after 09:00 via law enforcement.  Nurse call report to [phone].  Nurse Reita Cliche informed.

## 2020-03-15 NOTE — Progress Notes (Signed)
Adult Psychoeducational Group Note  Date:  03/15/2020 Time:  8:21 PM  Group Topic/Focus:  Wrap-Up Group:   The focus of this group is to help patients review their daily goal of treatment and discuss progress on daily workbooks.  Participation Level:  Did Not Attend  Participation Quality:  Did Not Attend  Affect:  Did Not Attend  Cognitive:  Did Not Attend  Insight: None  Engagement in Group:  Did Not Attend  Modes of Intervention:  Did Not Attend  Additional Comments:  Pt did not attend evening wrap up group tonight.  Felipa Furnace 03/15/2020, 8:21 PM
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
## 2020-03-15 NOTE — ED Notes (Signed)
Pt to [facility] per provider. Pt alert, cooperative, no s/s of distress. DC information and belongings given to [employer] for [facility]. Pt ambulatory, escorted and transported by [facility]

## 2020-03-16 DIAGNOSIS — F1910 Other psychoactive substance abuse, uncomplicated: Secondary | ICD-10-CM | POA: Clinically undetermined

## 2020-03-16 DIAGNOSIS — F1220 Cannabis dependence, uncomplicated: Secondary | ICD-10-CM | POA: Diagnosis not present

## 2020-03-16 DIAGNOSIS — F312 Bipolar disorder, current episode manic severe with psychotic features: Secondary | ICD-10-CM | POA: Diagnosis not present

## 2020-03-16 DIAGNOSIS — F3113 Bipolar disorder, current episode manic without psychotic features, severe: Secondary | ICD-10-CM | POA: Diagnosis not present

## 2020-03-16 LAB — LIPID PANEL
Cholesterol: 202 mg/dL — ABNORMAL HIGH (ref 0–200)
HDL: 44 mg/dL (ref >40)
LDL Cholesterol: 136 mg/dL — ABNORMAL HIGH (ref 0–99)
Total CHOL/HDL Ratio: 4.6 RATIO
Triglycerides: 108 mg/dL (ref <150)
VLDL: 22 mg/dL (ref 0–40)

## 2020-03-16 LAB — VALPROIC ACID LEVEL: Valproic Acid Lvl: 50 ug/mL (ref 50.0–100.0)

## 2020-03-16 LAB — TSH: TSH: 0.741 u[IU]/mL (ref 0.350–4.500)

## 2020-03-16 MED ORDER — DIVALPROEX SODIUM ER 500 MG PO TB24
1000.0000 mg | ORAL_TABLET | Freq: Every day | ORAL | Status: DC
  Administered 2020-03-16 – 2020-03-17 (×2): 1000 mg via ORAL
  Filled 2020-03-16 (×4): qty 2

## 2020-03-16 MED ORDER — DIVALPROEX SODIUM ER 500 MG PO TB24
500.0000 mg | ORAL_TABLET | ORAL | Status: DC
  Administered 2020-03-17 – 2020-03-18 (×2): 500 mg via ORAL
  Filled 2020-03-16 (×4): qty 1

## 2020-03-16 MED ORDER — DIVALPROEX SODIUM ER 500 MG PO TB24
ORAL_TABLET | ORAL | Status: AC
  Filled 2020-03-16: qty 3

## 2020-03-16 MED ORDER — OLANZAPINE 10 MG PO TBDP
20.0000 mg | ORAL_TABLET | Freq: Once | ORAL | Status: AC
  Administered 2020-03-16: 20 mg via ORAL
  Filled 2020-03-16: qty 2

## 2020-03-16 MED ORDER — DIVALPROEX SODIUM ER 500 MG PO TB24
1500.0000 mg | ORAL_TABLET | Freq: Once | ORAL | Status: AC
  Administered 2020-03-16: 1500 mg via ORAL
  Filled 2020-03-16: qty 3

## 2020-03-16 NOTE — Progress Notes (Signed)
On approach, the patient was pleasant and cooperative. She reported previously taking Seroquel 400 mg at night and Depakote. She reported that both medications were helpful but the Seroquel did not help her sleep. She reported hallucinating yesterday and was apologetic for acting out. She denied SI/HI. She denied AVH.  Late morning she became verbally aggressive and threatening towards staff. She was observed provoking another patient on the unit and verbally aggressive towards that patient. On several occassions she was threatening, stating "I will tear this bitch down." She was heard slamming the doors in her room. She was difficult to redirect and refused po meds for agitation. She was administered IM prn medications for agitation without force or a manual hold. She then came up to the nurses station apologizing and stating that she likes getting the injections because of the way it makes her feel high. She stated, "you have to remember I used to chew ecstasy."   Orders reviewed. Vital signs reviewed. Verbal support provided. 15 minute checks performed for safety. Patient remains  on unit restriction for threatening behaviors.
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
## 2020-03-16 NOTE — Progress Notes (Signed)
Writer was speaking with another patient and she came up to nursing station, when asked if I could help her she asked could she get the phone numbers out of her locker for the towing company, When Clinical research associate asked if she had requested the numbers on day shift and explained to her that we are not to go back in lockers once belongings are locked up. I reported to her that now was not a good time but maybe later. She became upset and started cussing " fuck you bitch you ain't trying to help me." She then went in the dayroom and picked up the cup of colored pencils and threw tham in the floor. Walked down the hall yelling and cussing, slammed her door and still yelling where she could be heard at nursing station. Writer walked down to her room and spoke to her about her behavior and disturbing other patients on the hall. She reported that she didn't give a fuck. Writer asked that she come and take her medications early. She eventually came to take her meds. Will continue to monitor patients behavior.
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
## 2020-03-16 NOTE — [facility] Group Notes (Signed)
[facility] LCSW Group Therapy  03/16/2020 3:31 PM   Type of Therapy and Topic: Group Therapy: Anger Management   Description of Group: In this group, patients will learn helpful strategies and techniques to manage anger, express anger in alternative ways, change hostile attitudes, and prevent aggressive acts, such as verbal abuse and violence.This group will be process-oriented and eductional, with patients participating in exploration of their own experiences as well as giving and receiving support and challenge from other group members.  Therapeutic Goals: 1. Patient will learn to manage anger. 2. Patient will learn to stop violence or the threat of violence. 3. Patient will learn to develop self control over thoughts and actions. 4. Patient will receive support and feedback from others  Therapeutic Modalities: Cognitive Behavioral Therapy Solution Focused Therapy Motivational Interviewing  Type of Therapy:  Group Therapy  Participation Level:  Did Not Attend   Summary of Progress/Problems: This patient was invited to attend group by CSW, however this patient declined to attend.   Zaniya Mcaulay A Sunnie Odden 03/16/2020, 3:31 PM  

## 2020-03-16 NOTE — Progress Notes (Signed)
   03/16/20 2030  COVID-19 Daily Checkoff  Have you had a fever (temp > 37.80C/100F)  in the past 24 hours?  No  If you have had runny nose, nasal congestion, sneezing in the past 24 hours, has it worsened? No  COVID-19 EXPOSURE  Have you traveled outside the state in the past 14 days? No  Have you been in contact with someone with a confirmed diagnosis of COVID-19 or PUI in the past 14 days without wearing appropriate PPE? No  Have you been living in the same home as a person with confirmed diagnosis of COVID-19 or a PUI (household contact)? No  Have you been diagnosed with COVID-19? No

## 2020-03-16 NOTE — Progress Notes (Signed)
   03/15/20 2355  COVID-19  Universal Masking-Does patient have mask on (ED/Ambulatory) / is patient wearing mask when staff enters room or during transport (Inpatient)? No  Copy of COVID-19 lab result in Chart? (if applicable) Yes - validated lab result is available in Results Review  Infection/Precautions Assessment - Complete every shift  (For questions, contact infection prevention at [phone].  On nights/weekends call [phone].)   See Sidebar for Inpatient Isolation Quick Reference Guide  Standard Precautions Assessment complete - Standard precautions

## 2020-03-17 ENCOUNTER — Inpatient Hospital Stay (HOSPITAL_COMMUNITY): Admission: AD | Admit: 2020-03-17 | Payer: No Typology Code available for payment source | Admitting: Psychiatry

## 2020-03-17 DIAGNOSIS — F1910 Other psychoactive substance abuse, uncomplicated: Secondary | ICD-10-CM

## 2020-03-17 LAB — HEMOGLOBIN A1C
Hgb A1c MFr Bld: 4.9 % (ref 4.8–5.6)
Mean Plasma Glucose: 94 mg/dL

## 2020-03-17 MED ORDER — TRAZODONE HCL 100 MG PO TABS
100.0000 mg | ORAL_TABLET | Freq: Every evening | ORAL | Status: DC | PRN
  Administered 2020-03-17: 100 mg via ORAL
  Filled 2020-03-17: qty 1

## 2020-03-17 NOTE — Progress Notes (Signed)
   03/17/20 2203  Psych Admission Type (Psych Patients Only)  Admission Status Involuntary  Psychosocial Assessment  Patient Complaints Irritability  Eye Contact Fair  Facial Expression Angry;Animated  Affect Angry;Anxious;Apprehensive;Irritable  Speech Logical/coherent  Interaction Assertive;Attention-seeking  Motor Activity Fidgety;Restless  Appearance/Hygiene Improved  Behavior Characteristics Impulsive  Mood Labile;Irritable  Thought Process  Coherency Concrete thinking;Flight of ideas  Content Confabulation  Delusions Paranoid  Perception Hallucinations  Hallucination Auditory  Judgment Poor  Confusion None  Danger to Self  Current suicidal ideation? Denies  Danger to Others  Danger to Others Reported or observed  Danger to Others Abnormal  Harmful Behavior to others Threats of violence towards other people observed or expressed   Destructive Behavior No threats or harm toward property  Description of Harmful Behavior  ("I'm having HI to hurt staff but I'm kidding")  D: Patient in dayroom engaging with staff and peers. Pt reports he has been sleeping a lot from medication given earlier. Pt made reference about having HI towards staff but later stated she was kidding.  A: Medications administered as prescribed. Support and encouragement provided as needed.  R: Patient remains safe on the unit. Will continue to monitor for safety and stability.

## 2020-03-17 NOTE — Progress Notes (Signed)
Progress note    03/17/20 0910  Psych Admission Type (Psych Patients Only)  Admission Status Involuntary  Psychosocial Assessment  Patient Complaints Agitation;Anxiety;Irritability;Restlessness  Eye Contact Fair  Facial Expression Angry;Animated  Affect Angry;Anxious;Apprehensive;Irritable  Speech Logical/coherent  Interaction Assertive;Attention-seeking  Motor Activity Fidgety;Restless  Appearance/Hygiene Improved  Behavior Characteristics Cooperative;Agitated;Anxious;Fidgety;Impulsive  Mood Anxious;Irritable;Pleasant  Thought Process  Coherency Concrete thinking;Flight of ideas  Content Confabulation  Delusions Paranoid  Perception Hallucinations  Hallucination Auditory  Judgment Poor  Confusion None  Danger to Self  Current suicidal ideation? Denies  Danger to Others  Danger to Others None reported or observed

## 2020-03-17 NOTE — Progress Notes (Signed)
Adult Psychoeducational Group Note  Date:  03/17/2020 Time:  8:49 PM  Group Topic/Focus:  Wrap-Up Group:   The focus of this group is to help patients review their daily goal of treatment and discuss progress on daily workbooks.  Participation Level:  Did Not Attend  Participation Quality:  Did Not Attend  Affect:  Did Not Attend  Cognitive:  Did Not Attend  Insight: None  Engagement in Group:  Did Not Attend  Modes of Intervention:  Did Not Attend  Additional Comments:  Pt did not attend evening wrap up group tonight.  Felipa Furnace 03/17/2020, 8:49 PM

## 2020-03-17 NOTE — [facility] Suicide Risk Assessment (Signed)
[facility] INPATIENT:  Family/Significant Other Suicide Prevention Education  Suicide Prevention Education: Education Completed;  Lois Huxley [phone]), has been identified by the patient as the family member/significant other with whom the patient will be residing, and identified as the person(s) who will aid the patient in the event of a mental health crisis (suicidal ideations/suicide attempt).  With written consent from the patient, the family member/significant other has been provided the following suicide prevention education, prior to the and/or following the discharge of the patient.  The suicide prevention education provided includes the following:  Suicide risk factors  Suicide prevention and interventions  National Suicide Hotline telephone number  [facility] assessment telephone number  [facility] Emergency Assistance 911  [facility] and/or Residential Mobile Crisis Unit telephone number   Request made of family/significant other to:  Remove weapons (e.g., guns, rifles, knives), all items previously/currently identified as safety concern.    Remove drugs/medications (over-the-counter, prescriptions, illicit drugs), all items previously/currently identified as a safety concern.   The family member/significant other verbalizes understanding of the suicide prevention education information provided.  The family member/significant other agrees to remove the items of safety concern listed above.  CSW spoke with this patients aunt who reported that this patient is currently living with her and stated she has no safety concerns with this patient returning home. Patients aunt stated that this patients daughter is staying with the patients sister and that her son is staying with his father. Patients aunt was unable to provide further information around the incident that led to this patients hospitalization.    Ruthann Cancer MSW, LCSW Clincal Social  Worker  [facility]

## 2020-03-17 NOTE — Progress Notes (Signed)
Progress note  Pt was seen trying to enter another pt's room. Pt was provided education and redirection which was met with hostility and aggression. Pt then began to antagonize another pt on the unit about using the phone. Pt began using racial slurs and threatening the pt. Pt threatened, but stated they would only fight if the other person hit them first. Pt continues to be labile and show little insight into their treatment or discharge plan.

## 2020-03-17 NOTE — [facility] Group Notes (Signed)
The focus of this group is to help patients establish daily goals to achieve during treatment and discuss how the patient can incorporate goal setting into their daily lives to aide in recovery.  PT stated that her goal  Is to' "stop running my mouth and to work on her boundaries."

## 2020-03-17 NOTE — Progress Notes (Signed)
Recreation Therapy Notes  Date: 10.18.21 Time: 1000 Location:  500 Hall Dayroom  Group Topic: Communication, Team Building, Problem Solving  Goal Area(s) Addresses:  Patient will effectively work with peer towards shared goal.  Patient will identify skills used to make activity successful.  Patient will identify how skills used during activity can be used to reach post d/c goals.   Behavioral Response: None  Intervention: STEM Activity  Activity: Landing Pad. In teams patients were given 12 plastic drinking straws and a length of masking tape. Using the materials provided patients were asked to build a landing pad to catch a golf ball dropped from approximately 6 feet in the air.   Education: Pharmacist, community, Discharge Planning   Education Outcome: Acknowledges education/In group clarification offered/Needs additional education.   Clinical Observations/Feedback: Pt got instructions for activity.  Pt observed and listened to peers.  Pt left early and did not return.    Caroll Rancher, LRT/CTRS         Caroll Rancher A 03/17/2020 12:01 PM

## 2020-03-17 NOTE — Progress Notes (Signed)
[facility] MD Progress Note  03/17/2020 1:36 PM Nancy Schneider  MRN:  [id number] Subjective:  Patient is a 34 year old transferred form [facility] for stabilization and treatment of agitation, dangerous disruptive behavior and polysubstance use.  Patient on presentation was manic, agitated.  Also patient reported that she does not like taking medications but knows now that she needs to as she wants to be there for her kids.  Patient reports that she only slept 2 hours last night, struggles with slowing down her mind, as that is difficult for her to keep up with her thoughts.  She states that she feels she needs to go home, discussed in length with patient the need for her to take her medications, work on her coping skills, the need to get adequate sleep and manage her illness  Patient denies any side effects with the medications, reports that she is diagnosed with bipolar disorder. Principal Problem: Affective psychosis, bipolar (HCC) Diagnosis: Principal Problem:   Affective psychosis, bipolar (HCC) Active Problems:   Bipolar disorder with severe mania (HCC)   Cannabis use disorder, moderate, dependence (HCC)   Polysubstance abuse (HCC)  Total Time spent with patient: 30 minutes  Past Psychiatric History: unchanged from admission  Past Medical History: History reviewed. No pertinent past medical history.  Past Surgical History:  Procedure Laterality Date  . CESAREAN SECTION     Family History: History reviewed. No pertinent family history. Family Psychiatric  History: unchanged Social History:  Social History   Substance and Sexual Activity  Alcohol Use No     Social History   Substance and Sexual Activity  Drug Use No    Social History   Socioeconomic History  . Marital status: Single    Spouse name: Not on file  . Number of children: Not on file  . Years of education: Not on file  . Highest education level: Not on file  Occupational History  . Not on file  Tobacco Use   . Smoking status: Never Smoker  . Smokeless tobacco: Never Used  Substance and Sexual Activity  . Alcohol use: No  . Drug use: No  . Sexual activity: Not Currently  Other Topics Concern  . Not on file  Social History Narrative  . Not on file   Social Determinants of Health   Financial Resource Strain:   . Difficulty of Paying Living Expenses: Not on file  Food Insecurity:   . Worried About Programme researcher, broadcasting/film/video in the Last Year: Not on file  . Ran Out of Food in the Last Year: Not on file  Transportation Needs:   . Lack of Transportation (Medical): Not on file  . Lack of Transportation (Non-Medical): Not on file  Physical Activity:   . Days of Exercise per Week: Not on file  . Minutes of Exercise per Session: Not on file  Stress:   . Feeling of Stress : Not on file  Social Connections:   . Frequency of Communication with Friends and Family: Not on file  . Frequency of Social Gatherings with Friends and Family: Not on file  . Attends Religious Services: Not on file  . Active Member of Clubs or Organizations: Not on file  . Attends Banker Meetings: Not on file  . Marital Status: Not on file   Additional Social History:                         Sleep: Poor  Appetite:  Good  Current Medications: Current Facility-Administered Medications  Medication Dose Route Frequency Provider Last Rate Last Admin  . acetaminophen (TYLENOL) tablet 650 mg  650 mg Oral Q6H PRN Aldean BakerSykes, Janet E, NP   650 mg at 03/17/20 0440  . alum & mag hydroxide-simeth (MAALOX/MYLANTA) 200-200-20 MG/5ML suspension 30 mL  30 mL Oral Q4H PRN Aldean BakerSykes, Janet E, NP      . diphenhydrAMINE (BENADRYL) capsule 50 mg  50 mg Oral Q6H PRN Mariel CraftMaurer, Sheila M, MD   50 mg at 03/16/20 2022   Or  . diphenhydrAMINE (BENADRYL) injection 50 mg  50 mg Intramuscular Q6H PRN Mariel CraftMaurer, Sheila M, MD   50 mg at 03/16/20 1212  . divalproex (DEPAKOTE ER) 24 hr tablet 1,000 mg  1,000 mg Oral QHS Mariel CraftMaurer, Sheila M, MD    1,000 mg at 03/16/20 2022  . divalproex (DEPAKOTE ER) 24 hr tablet 500 mg  500 mg Oral Theodora [id number] Maurer, Sheila M, MD   500 mg at 03/17/20 [id number]  . hydrOXYzine (ATARAX/VISTARIL) tablet 25 mg  25 mg Oral TID PRN Aldean BakerSykes, Janet E, NP   25 mg at 03/17/20 0910  . LORazepam (ATIVAN) tablet 2 mg  2 mg Oral Q6H PRN Mariel CraftMaurer, Sheila M, MD   2 mg at 03/17/20 0910   Or  . LORazepam (ATIVAN) injection 2 mg  2 mg Intramuscular Q6H PRN Mariel CraftMaurer, Sheila M, MD   2 mg at 03/16/20 1213  . magnesium hydroxide (MILK OF MAGNESIA) suspension 30 mL  30 mL Oral Daily PRN Aldean BakerSykes, Janet E, NP      . OLANZapine (ZYPREXA) tablet 10 mg  10 mg Oral BID Mariel CraftMaurer, Sheila M, MD   10 mg at 03/17/20 0910  . OLANZapine zydis (ZYPREXA) disintegrating tablet 10 mg  10 mg Oral Q8H PRN Aldean BakerSykes, Janet E, NP      . traZODone (DESYREL) tablet 50 mg  50 mg Oral QHS PRN Aldean BakerSykes, Janet E, NP   50 mg at 03/16/20 2002    Lab Results:  Results for orders placed or performed during the hospital encounter of 03/15/20 (from the past 48 hour(s))  Valproic acid level     Status: None   Collection Time: 03/16/20  7:38 AM  Result Value Ref Range   Valproic Acid Lvl 50 50.0 - 100.0 ug/mL    Comment: Performed at [facility], [address] [place], [place] [address]  TSH     Status: None   Collection Time: 03/16/20  7:38 AM  Result Value Ref Range   TSH 0.741 0.350 - 4.500 uIU/mL    Comment: Performed by a 3rd Generation assay with a functional sensitivity of <=0.01 uIU/mL. Performed at [facility], [address] [place], [place] [address]   Lipid panel     Status: Abnormal   Collection Time: 03/16/20  7:38 AM  Result Value Ref Range   Cholesterol 202 (H) 0 - 200 mg/dL   Triglycerides 191108 <478<150 mg/dL   HDL 44 >29>40 mg/dL   Total CHOL/HDL Ratio 4.6 RATIO   VLDL 22 0 - 40 mg/dL   LDL Cholesterol 562136 (H) 0 - 99 mg/dL    Comment:        Total Cholesterol/HDL:CHD Risk Coronary Heart Disease Risk Table                      Men   Women  1/2 Average Risk   3.4   3.3  Average Risk  5.0   4.4  2 X Average Risk   9.6   7.1  3 X Average Risk  23.4   11.0        Use the calculated Patient Ratio above and the CHD Risk Table to determine the patient's CHD Risk.        ATP III CLASSIFICATION (LDL):  <100     mg/dL   Optimal  098-119  mg/dL   Near or Above                    Optimal  130-159  mg/dL   Borderline  147-829  mg/dL   High  >562     mg/dL   Very High Performed at [facility], [address], [place], [place] [address]   Hemoglobin A1c     Status: None   Collection Time: 03/16/20  7:38 AM  Result Value Ref Range   Hgb A1c MFr Bld 4.9 4.8 - 5.6 %    Comment: (NOTE)         Prediabetes: 5.7 - 6.4         Diabetes: >6.4         Glycemic control for adults with diabetes: <7.0    Mean Plasma Glucose 94 mg/dL    Comment: (NOTE) Performed At: [facility] [address] [place], [place] [id number] Jolene Schimke MD BM:[id number]     Blood Alcohol level:  Lab Results  Component Value Date   [facility] <10 03/13/2020    Metabolic Disorder Labs: Lab Results  Component Value Date   HGBA1C 4.9 03/16/2020   MPG 94 03/16/2020   No results found for: PROLACTIN Lab Results  Component Value Date   CHOL 202 (H) 03/16/2020   TRIG 108 03/16/2020   HDL 44 03/16/2020   CHOLHDL 4.6 03/16/2020   VLDL 22 03/16/2020   LDLCALC 136 (H) 03/16/2020    Physical Findings: AIMS: Facial and Oral Movements Muscles of Facial Expression: None, normal Lips and Perioral Area: None, normal Jaw: None, normal Tongue: None, normal,Extremity Movements Upper (arms, wrists, hands, fingers): None, normal Lower (legs, knees, ankles, toes): None, normal, Trunk Movements Neck, shoulders, hips: None, normal, Overall Severity Severity of abnormal movements (highest score from questions above): None, normal Incapacitation due to abnormal movements: None, normal Patient's awareness of  abnormal movements (rate only patient's report): No Awareness, Dental Status Current problems with teeth and/or dentures?: No Does patient usually wear dentures?: No  CIWA:    COWS:     Musculoskeletal: Strength & Muscle Tone: within normal limits Gait & Station: normal Patient leans: N/A  Psychiatric Specialty Exam: Physical Exam  Review of Systems  Constitutional: Negative.   HENT: Negative.   Eyes: Negative.   Respiratory: Negative.  Negative for cough, chest tightness and shortness of breath.   Cardiovascular: Negative.  Negative for chest pain and palpitations.  Gastrointestinal: Negative.  Negative for nausea and vomiting.  Endocrine: Negative.   Musculoskeletal: Negative.   Skin: Negative.   Allergic/Immunologic: Negative.   Neurological: Negative.  Negative for dizziness, facial asymmetry, speech difficulty, numbness and headaches.  Hematological: Negative.   Psychiatric/Behavioral: Positive for behavioral problems, decreased concentration and sleep disturbance. Negative for agitation, dysphoric mood and hallucinations. The patient is not nervous/anxious.     Blood pressure 112/88, pulse 76, temperature 98.1 F (36.7 C), temperature source Oral, resp. rate 18, SpO2 100 %.There is no height or weight on file to calculate BMI.  General Appearance: Casual  Eye  Contact:  Fair  Speech:  Clear and Coherent and Normal Rate  Volume:  Normal  Mood:  Euphoric and Irritable  Affect:  Blunt and Labile  Thought Process:  Linear and Descriptions of Associations: Circumstantial  Orientation:  Full (Time, Place, and Person)  Thought Content:  Logical, Illogical and Rumination  Suicidal Thoughts:  No  Homicidal Thoughts:  No  Memory:  Immediate;   Fair Recent;   Fair Remote;   Fair  Judgement:  Impaired  Insight:  Shallow  Psychomotor Activity:  Mannerisms  Concentration:  Concentration: Poor and Attention Span: Poor  Recall:  Poor  Fund of Knowledge:  Good  Language:  Fair   Akathisia:  No  Handed:  Right  AIMS (if indicated):     Assets:  Physical Health  ADL's:  Impaired  Cognition:  WNL  Sleep:  Number of Hours: 2.25     Treatment Plan Summary: Daily contact with patient to assess and evaluate symptoms and progress in treatment, Medication management and Plan to continue to monitor patient closely  To continue Zyprexa 10 mg twice daily for psychosis. To increase trazodone 200 mg as needed for sleep To continue hydroxyzine to 25 mg daily as needed for anxiety To continue Depakote 500 mg in the morning and 1000 mg at bedtime for mood stabilization Patient to continue to participate in therapeutic milieu To continue every 15 minute checks Patient to have a repeat Depakote level Wednesday morning, possible discharge planning for Wednesday 50% of this visit was spent in discussing with patient her illness, the need for medications, her coping skills, the need for her to be able to look after her children, also discussed substance use issues in length with patient.  Patient does have a long history of medication noncompliance and would benefit with a long-acting injectable to keep patient stable in the community  Nelly Rout, MD 03/17/2020, 1:36 PM

## 2020-03-17 NOTE — Tx Team (Cosign Needed)
Interdisciplinary Treatment and Diagnostic Plan Update  03/17/2020 Time of Session: 9:45am Kani Jobson MRN: [id number]  Principal Diagnosis: Affective psychosis, bipolar ([place])  Secondary Diagnoses: Principal Problem:   Affective psychosis, bipolar ([place]) Active Problems:   Bipolar disorder with severe mania ([place])   Cannabis use disorder, moderate, dependence ([place])   Polysubstance abuse ([place])   Current Medications:  Current Facility-Administered Medications  Medication Dose Route Frequency Provider Last Rate Last Admin  . acetaminophen (TYLENOL) tablet 650 mg  650 mg Oral Q6H PRN Connye Burkitt, NP   650 mg at 03/17/20 0440  . alum & mag hydroxide-simeth (MAALOX/MYLANTA) 200-200-20 MG/5ML suspension 30 mL  30 mL Oral Q4H PRN Connye Burkitt, NP      . diphenhydrAMINE (BENADRYL) capsule 50 mg  50 mg Oral Q6H PRN Lavella Hammock, MD   50 mg at 03/16/20 2022   Or  . diphenhydrAMINE (BENADRYL) injection 50 mg  50 mg Intramuscular Q6H PRN Lavella Hammock, MD   50 mg at 03/16/20 1212  . divalproex (DEPAKOTE ER) 24 hr tablet 1,000 mg  1,000 mg Oral QHS Lavella Hammock, MD   1,000 mg at 03/16/20 2022  . divalproex (DEPAKOTE ER) 24 hr tablet 500 mg  500 mg Oral Doroteo Bradford, MD   500 mg at 03/17/20 9476  . hydrOXYzine (ATARAX/VISTARIL) tablet 25 mg  25 mg Oral TID PRN Connye Burkitt, NP   25 mg at 03/17/20 0910  . LORazepam (ATIVAN) tablet 2 mg  2 mg Oral Q6H PRN Lavella Hammock, MD   2 mg at 03/17/20 0910   Or  . LORazepam (ATIVAN) injection 2 mg  2 mg Intramuscular Q6H PRN Lavella Hammock, MD   2 mg at 03/16/20 1213  . magnesium hydroxide (MILK OF MAGNESIA) suspension 30 mL  30 mL Oral Daily PRN Connye Burkitt, NP      . OLANZapine (ZYPREXA) tablet 10 mg  10 mg Oral BID Lavella Hammock, MD   10 mg at 03/17/20 0910  . OLANZapine zydis (ZYPREXA) disintegrating tablet 10 mg  10 mg Oral Q8H PRN Connye Burkitt, NP      . traZODone (DESYREL) tablet 50 mg  50 mg Oral QHS PRN Connye Burkitt, NP   50 mg at 03/16/20 2002   PTA Medications: Medications Prior to Admission  Medication Sig Dispense Refill Last Dose  . divalproex (DEPAKOTE ER) 500 MG 24 hr tablet Take 500 mg by mouth daily.     . Prenatal Vit-Fe Fumarate-FA (PRENATAL PO) Take 1 tablet by mouth daily.     . QUEtiapine (SEROQUEL) 100 MG tablet Take 100 mg by mouth at bedtime.       Patient Stressors:    Patient Strengths:    Treatment Modalities: Medication Management, Group therapy, Case management,  1 to 1 session with clinician, Psychoeducation, Recreational therapy.   Physician Treatment Plan for Primary Diagnosis: Affective psychosis, bipolar ([place]) Long Term Goal(s): Improvement in symptoms so as ready for discharge Improvement in symptoms so as ready for discharge   Short Term Goals: Ability to identify changes in lifestyle to reduce recurrence of condition will improve Ability to verbalize feelings will improve Ability to disclose and discuss suicidal ideas Ability to demonstrate self-control will improve Ability to identify and develop effective coping behaviors will improve Ability to maintain clinical measurements within normal limits will improve Compliance with prescribed medications will improve Ability to identify triggers associated with substance abuse/mental health issues  will improve Ability to identify changes in lifestyle to reduce recurrence of condition will improve Ability to verbalize feelings will improve Ability to disclose and discuss suicidal ideas Ability to demonstrate self-control will improve Ability to identify and develop effective coping behaviors will improve Ability to maintain clinical measurements within normal limits will improve Compliance with prescribed medications will improve Ability to identify triggers associated with substance abuse/mental health issues will improve  Medication Management: Evaluate patient's response, side effects, and tolerance of  medication regimen.  Therapeutic Interventions: 1 to 1 sessions, Unit Group sessions and Medication administration.  Evaluation of Outcomes: Not Met  Physician Treatment Plan for Secondary Diagnosis: Principal Problem:   Affective psychosis, bipolar ([place]) Active Problems:   Bipolar disorder with severe mania ([place])   Cannabis use disorder, moderate, dependence ([place])   Polysubstance abuse ([place])  Long Term Goal(s): Improvement in symptoms so as ready for discharge Improvement in symptoms so as ready for discharge   Short Term Goals: Ability to identify changes in lifestyle to reduce recurrence of condition will improve Ability to verbalize feelings will improve Ability to disclose and discuss suicidal ideas Ability to demonstrate self-control will improve Ability to identify and develop effective coping behaviors will improve Ability to maintain clinical measurements within normal limits will improve Compliance with prescribed medications will improve Ability to identify triggers associated with substance abuse/mental health issues will improve Ability to identify changes in lifestyle to reduce recurrence of condition will improve Ability to verbalize feelings will improve Ability to disclose and discuss suicidal ideas Ability to demonstrate self-control will improve Ability to identify and develop effective coping behaviors will improve Ability to maintain clinical measurements within normal limits will improve Compliance with prescribed medications will improve Ability to identify triggers associated with substance abuse/mental health issues will improve     Medication Management: Evaluate patient's response, side effects, and tolerance of medication regimen.  Therapeutic Interventions: 1 to 1 sessions, Unit Group sessions and Medication administration.  Evaluation of Outcomes: Not Met   RN Treatment Plan for Primary Diagnosis: Affective psychosis, bipolar ([place]) Long Term  Goal(s): Knowledge of disease and therapeutic regimen to maintain health will improve  Short Term Goals: Ability to participate in decision making will improve, Ability to identify and develop effective coping behaviors will improve and Compliance with prescribed medications will improve  Medication Management: RN will administer medications as ordered by provider, will assess and evaluate patient's response and provide education to patient for prescribed medication. RN will report any adverse and/or side effects to prescribing provider.  Therapeutic Interventions: 1 on 1 counseling sessions, Psychoeducation, Medication administration, Evaluate responses to treatment, Monitor vital signs and CBGs as ordered, Perform/monitor CIWA, COWS, AIMS and Fall Risk screenings as ordered, Perform wound care treatments as ordered.  Evaluation of Outcomes: Not Met   LCSW Treatment Plan for Primary Diagnosis: Affective psychosis, bipolar ([place]) Long Term Goal(s): Safe transition to appropriate next level of care at discharge, Engage patient in therapeutic group addressing interpersonal concerns.  Short Term Goals: Engage patient in aftercare planning with referrals and resources, Increase emotional regulation, Facilitate patient progression through stages of change regarding substance use diagnoses and concerns and Identify triggers associated with mental health/substance abuse issues  Therapeutic Interventions: Assess for all discharge needs, 1 to 1 time with Social worker, Explore available resources and support systems, Assess for adequacy in community support network, Educate family and significant other(s) on suicide prevention, Complete Psychosocial Assessment, Interpersonal group therapy.  Evaluation of Outcomes: Not Met  Progress in Treatment: Attending groups: No. Participating in groups: No. Taking medication as prescribed: Yes. Toleration medication: Yes. Family/Significant other contact made:  No, will contact:  aunt Donell Sievert [phone]) Patient understands diagnosis: Yes. Discussing patient identified problems/goals with staff: Yes. Medical problems stabilized or resolved: Yes. Denies suicidal/homicidal ideation: Yes. Issues/concerns per patient self-inventory: No. Other:   New problem(s) identified: No, Describe:  CSW will continuw to assess  New Short Term/Long Term Goal(s):medication stabilization, elimination of SI thoughts, development of comprehensive mental wellness plan.  Patient Goals:  "good behavior"  Discharge Plan or Barriers: Patient recently admitted. CSW will continue to follow and assess for appropriate referrals and possible discharge planning.  Reason for Continuation of Hospitalization: Anxiety Medication stabilization Suicidal ideation  Estimated Length of Stay: 3-5 days  Attendees: Patient: Nancy Schneider 03/17/2020 10:25 AM  Physician: Dr. Jenne Campus 03/17/2020 10:25 AM  Nursing:  03/17/2020 10:25 AM  RN Care Manager: 03/17/2020 10:25 AM  Social Worker: Toney Reil, [place] 03/17/2020 10:25 AM  Recreational Therapist:  03/17/2020 10:25 AM  Other:  03/17/2020 10:25 AM  Other:  03/17/2020 10:25 AM  Other: 03/17/2020 10:25 AM    Scribe for Treatment Team: Mliss Fritz, [place] 03/17/2020 10:25 AM

## 2020-03-18 MED ORDER — OLANZAPINE 10 MG PO TABS
10.0000 mg | ORAL_TABLET | Freq: Two times a day (BID) | ORAL | 0 refills | Status: AC
Start: 2020-03-18 — End: ?

## 2020-03-18 MED ORDER — HYDROXYZINE HCL 25 MG PO TABS: 25.0000 mg | ORAL_TABLET | Freq: Three times a day (TID) | ORAL | 0 refills | Status: AC | PRN

## 2020-03-18 MED ORDER — TRAZODONE HCL 100 MG PO TABS: 100.0000 mg | ORAL_TABLET | Freq: Every evening | ORAL | 0 refills | Status: AC | PRN

## 2020-03-18 MED ORDER — DIVALPROEX SODIUM ER 500 MG PO TB24: 1000.0000 mg | ORAL_TABLET | Freq: Every day | ORAL | 0 refills | Status: AC

## 2020-03-18 MED ORDER — DIVALPROEX SODIUM ER 500 MG PO TB24: 500.0000 mg | ORAL_TABLET | ORAL | 0 refills | Status: AC

## 2020-03-18 NOTE — Progress Notes (Signed)
  [facility] Adult Case Management Discharge Plan :  Will you be returning to the same living situation after discharge:  Yes,  to aunts house At discharge, do you have transportation home?: No. Safe Transport will be arranged Do you have the ability to pay for your medications: Yes,  has insurance  Release of information consent forms completed and in the chart;  Patient's signature needed at discharge.  Patient to Follow up at:  Follow-up Information    [facility] [employer]. Go on 03/24/2020.   Why: You have a hospital follow up appointment on 03/24/20 at 2:00 pm for therapy and medication management services.  This appointment will be held in person. Contact information: [address] [place] [address] [phone]               Next level of care provider has access to [facility] Link:no  Safety Planning and Suicide Prevention discussed: Yes,  with aunt  Have you used any form of tobacco in the last 30 days? (Cigarettes, Smokeless Tobacco, Cigars, and/or Pipes): Patient Refused Screening  Has patient been referred to the Quitline?: Patient refused referral  Patient has been referred for addiction treatment: Pt. refused referral  Otelia Santee, LCSW 03/18/2020, 9:51 AM
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
## 2020-03-18 NOTE — Progress Notes (Signed)
Pt discharged to lobby. Pt was stable and appreciative at that time. All papers and prescriptions were given and valuables returned. Verbal understanding expressed. Denies SI/HI and A/VH. Pt given opportunity to express concerns and ask questions.  

## 2020-03-18 NOTE — [facility] Suicide Risk Assessment (Signed)
[facility] Discharge Suicide Risk Assessment   Principal Problem: Affective psychosis, bipolar (HCC) Discharge Diagnoses: Principal Problem:   Affective psychosis, bipolar (HCC) Active Problems:   Bipolar disorder with severe mania (HCC)   Cannabis use disorder, moderate, dependence (HCC)   Polysubstance abuse (HCC)   Total Time spent with patient: 30 minutes  Musculoskeletal: Strength & Muscle Tone: within normal limits Gait & Station: normal Patient leans: N/A  Psychiatric Specialty Exam: Review of Systems  All other systems reviewed and are negative.   Blood pressure 128/83, pulse 96, temperature 98.3 F (36.8 C), temperature source Oral, resp. rate 18, SpO2 100 %.There is no height or weight on file to calculate BMI.  General Appearance: Casual  Eye Contact::  Good  Speech:  Clear and Coherent409  Volume:  Normal  Mood:  Euthymic  Affect:  Appropriate  Thought Process:  Coherent  Orientation:  Full (Time, Place, and Person)  Thought Content:  Logical  Suicidal Thoughts:  No  Homicidal Thoughts:  No  Memory:  Recent;   Fair  Judgement:  Fair  Insight:  Fair  Psychomotor Activity:  Normal  Concentration:  Fair  Recall:  [employer] of Knowledge:Fair  Language: Fair  Akathisia:  No  Handed:  Right  AIMS (if indicated):     Assets:  Desire for Improvement Physical Health Resilience Social Support  Sleep:  Number of Hours: 3.5  Cognition: WNL  ADL's:  Intact   Mental Status Per Nursing Assessment::   On Admission:     Demographic Factors:  NA  Loss Factors: Legal issues and Financial problems/change in socioeconomic status  Historical Factors: Impulsivity  Risk Reduction Factors:   Responsible for children under 3 years of age, Sense of responsibility to family, Religious beliefs about death and Positive social support  Continued Clinical Symptoms:  Bipolar Disorder:   Mixed State  Cognitive Features That Contribute To Risk:  None    Suicide Risk:   Minimal: No identifiable suicidal ideation.  Patients presenting with no risk factors but with morbid ruminations; may be classified as minimal risk based on the severity of the depressive symptoms   Follow-up Information    [facility] Go on 03/24/2020.   Why: You have a hospital follow up appointment on 03/24/20 at 2:00 pm for therapy and medication management services.  This appointment will be held in person. Contact information: [address] [place] [address] [phone]               Plan Of Care/Follow-up recommendations:  Other:  Folow up outpatient  Clement Sayres, MD 03/18/2020, 9:59 AM

## 2022-01-04 ENCOUNTER — Ambulatory Visit: Payer: Medicaid Other | Admitting: Internal Medicine

## 2022-01-28 ENCOUNTER — Ambulatory Visit: Payer: Medicaid Other | Admitting: Internal Medicine

## 2022-02-18 ENCOUNTER — Ambulatory Visit: Payer: Medicaid Other | Admitting: Internal Medicine

## 2022-03-09 NOTE — Progress Notes (Deleted)
NEW PATIENT Date of Service/Encounter:  03/09/22 Referring provider: {Blank single:19197::"[facility]","none-self referred"} Primary care provider: [facility]  Subjective:  Nancy Schneider is a 36 y.o. female with a PMHx of bipolar disorder, migraines, polysubstance abuse,  presenting today for evaluation of *** History obtained from: chart review and {Persons; PED relatives w/patient:19415::"patient"}.   *** Other allergy screening: Asthma: {Blank single:19197::"yes","no"} Rhino conjunctivitis: {Blank single:19197::"yes","no"} Food allergy: {Blank single:19197::"yes","no"} Medication allergy: {Blank single:19197::"yes","no"} Hymenoptera allergy: {Blank single:19197::"yes","no"} Urticaria: {Blank single:19197::"yes","no"} Eczema:{Blank single:19197::"yes","no"} History of recurrent infections suggestive of immunodeficency: {Blank single:19197::"yes","no"} ***Vaccinations are up to date.   Past Medical History: No past medical history on file. Medication List:  Current Outpatient Medications  Medication Sig Dispense Refill   divalproex (DEPAKOTE ER) 500 MG 24 hr tablet Take 1 tablet (500 mg total) by mouth every morning. 30 tablet 0   divalproex (DEPAKOTE ER) 500 MG 24 hr tablet Take 2 tablets (1,000 mg total) by mouth at bedtime. 60 tablet 0   hydrOXYzine (ATARAX/VISTARIL) 25 MG tablet Take 1 tablet (25 mg total) by mouth 3 (three) times daily as needed for anxiety. 30 tablet 0   OLANZapine (ZYPREXA) 10 MG tablet Take 1 tablet (10 mg total) by mouth 2 (two) times daily. 60 tablet 0   traZODone (DESYREL) 100 MG tablet Take 1 tablet (100 mg total) by mouth at bedtime as needed for sleep. 15 tablet 0   No current facility-administered medications for this visit.   Known Allergies:  No Known Allergies Past Surgical History: Past Surgical History:  Procedure Laterality Date   CESAREAN SECTION     Family History: No family history on  file. Social History: [name] lives ***.   ROS:  All other systems negative except as noted per HPI.  Objective:  There were no vitals taken for this visit. There is no height or weight on file to calculate BMI. Physical Exam:  General Appearance:  Alert, cooperative, no distress, appears stated age  Head:  Normocephalic, without obvious abnormality, atraumatic  Eyes:  Conjunctiva clear, EOM's intact  Nose: Nares normal, {Blank multiple:19196:a:"***","hypertrophic turbinates","normal mucosa","no visible anterior polyps","septum midline"}  Throat: Lips, tongue normal; teeth and gums normal, {Blank multiple:19196:a:"***","normal posterior oropharynx","tonsils 2+","tonsils 3+","no tonsillar exudate","+ cobblestoning"}  Neck: Supple, symmetrical  Lungs:   {Blank multiple:19196:a:"***","clear to auscultation bilaterally","end-expiratory wheezing","wheezing throughout"}, Respirations unlabored, {Blank multiple:19196:a:"***","no coughing","intermittent dry coughing"}  Heart:  {Blank multiple:19196:a:"***","regular rate and rhythm","no murmur"}, Appears well perfused  Extremities: No edema  Skin: Skin color, texture, turgor normal, no rashes or lesions on visualized portions of skin  Neurologic: No gross deficits     Diagnostics: Spirometry:  Tracings reviewed. Her effort: {Blank single:19197::"Good reproducible efforts.","It was hard to get consistent efforts and there is a question as to whether this reflects a maximal maneuver.","Poor effort, data can not be interpreted.","Variable effort-results affected.","decent for first attempt at spirometry."} FVC: ***L (pre), ***L  (post) FEV1: ***L, ***% predicted (pre), ***L, ***% predicted (post) FEV1/FVC ratio: ***% (pre), ***% (post) Interpretation: {Blank single:19197::"Spirometry consistent with mild obstructive disease","Spirometry consistent with moderate obstructive disease","Spirometry consistent with severe obstructive disease","Spirometry  consistent with possible restrictive disease","Spirometry consistent with mixed obstructive and restrictive disease","Spirometry uninterpretable due to technique","Spirometry consistent with normal pattern","No overt abnormalities noted given today's efforts"} with *** bronchodilator response  Skin Testing: {Blank single:19197::"Select foods","Environmental allergy panel","Environmental allergy panel and select foods","Food allergy panel","None","Deferred due to recent antihistamines use"}. *** Adequate controls. Results discussed with patient/family.   {Blank single:19197::"Allergy testing results were read and interpreted by myself, documented by  clinical staff."," "}  Assessment and Plan  ***  {Blank single:19197::"This note in its entirety was forwarded to the Provider who requested this consultation."}  Thank you for your kind referral. I appreciate the opportunity to take part in Nancy Schneider's care. Please do not hesitate to contact me with questions.***  Sincerely,  Sigurd Sos, MD [facility] [place]

## 2022-03-11 ENCOUNTER — Ambulatory Visit: Payer: Medicaid Other | Admitting: Internal Medicine
